# Patient Record
Sex: Female | Born: 1990 | Race: Black or African American | Hispanic: No | Marital: Single | Smoking: Never smoker
Health system: Southern US, Community
[De-identification: ages and names within clinical notes are randomized; demographics above are authoritative.]

## PROBLEM LIST (undated history)

## (undated) HISTORY — PX: ABDOMINAL SURGERY: SHX537

---

## 2010-06-27 ENCOUNTER — Emergency Department (HOSPITAL_COMMUNITY)
Admission: EM | Admit: 2010-06-27 | Discharge: 2010-06-27 | Payer: Self-pay | Source: Home / Self Care | Admitting: Emergency Medicine

## 2014-12-17 ENCOUNTER — Other Ambulatory Visit (HOSPITAL_COMMUNITY): Payer: Self-pay | Admitting: Family Medicine

## 2014-12-17 DIAGNOSIS — Z3201 Encounter for pregnancy test, result positive: Secondary | ICD-10-CM

## 2014-12-19 ENCOUNTER — Other Ambulatory Visit (HOSPITAL_COMMUNITY): Payer: Self-pay | Admitting: Family Medicine

## 2014-12-19 ENCOUNTER — Ambulatory Visit (HOSPITAL_COMMUNITY)
Admission: RE | Admit: 2014-12-19 | Discharge: 2014-12-19 | Disposition: A | Discharge status: Home / Self Care | Payer: BC Managed Care – PPO | Source: Ambulatory Visit | Attending: Family Medicine | Admitting: Family Medicine

## 2014-12-19 DIAGNOSIS — Z3201 Encounter for pregnancy test, result positive: Secondary | ICD-10-CM | POA: Diagnosis not present

## 2015-11-25 IMAGING — US US OB COMP LESS 14 WK
1 series · 13 of 28 positions shown · non-contrast
Comparison: None.

CLINICAL DATA: 24-year-old female is pregnant with unknown LMP.
Initial encounter.

EXAM:
OBSTETRIC <14 WK US AND TRANSVAGINAL OB US
TECHNIQUE: Both transabdominal and transvaginal ultrasound examinations were
performed for complete evaluation of the gestation as well as the
maternal uterus, adnexal regions, and pelvic cul-de-sac.
Transvaginal technique was performed to assess early pregnancy.

[Series 1: us ob comp less 14 wk · 13 of 81 slices shown]
[im 3/81]
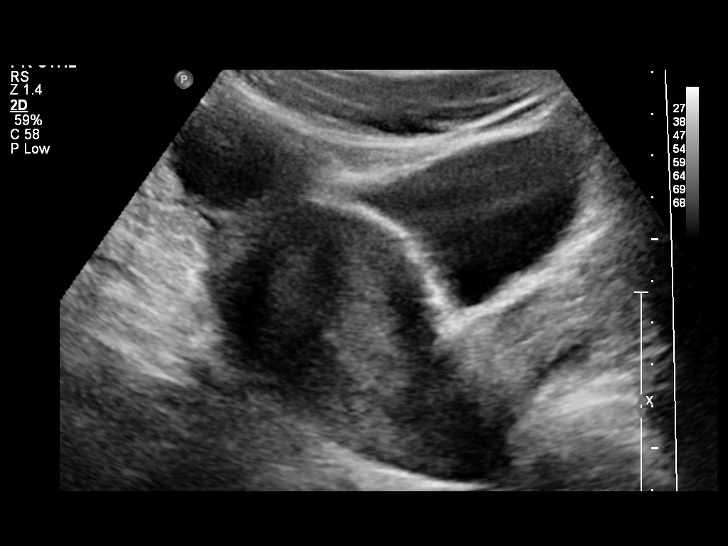
[im 9/81]
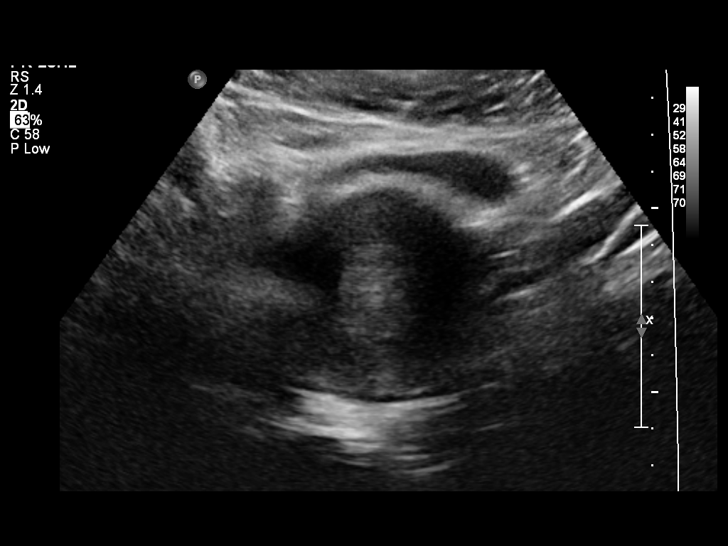
[im 15/81]
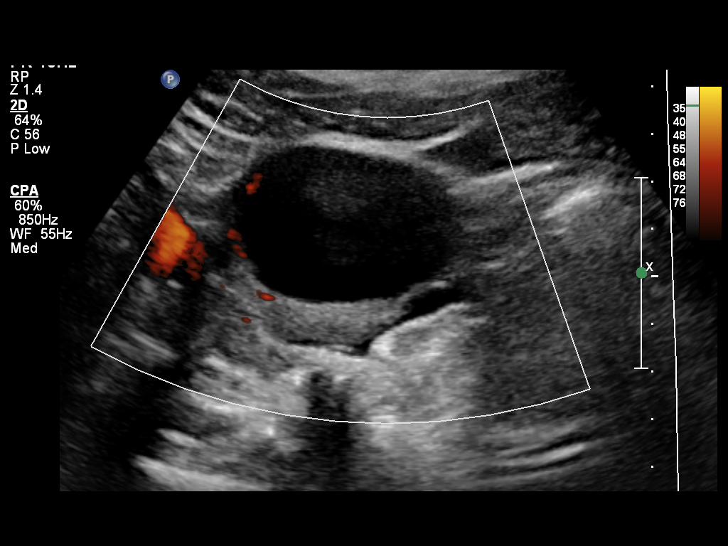
[im 21/81]
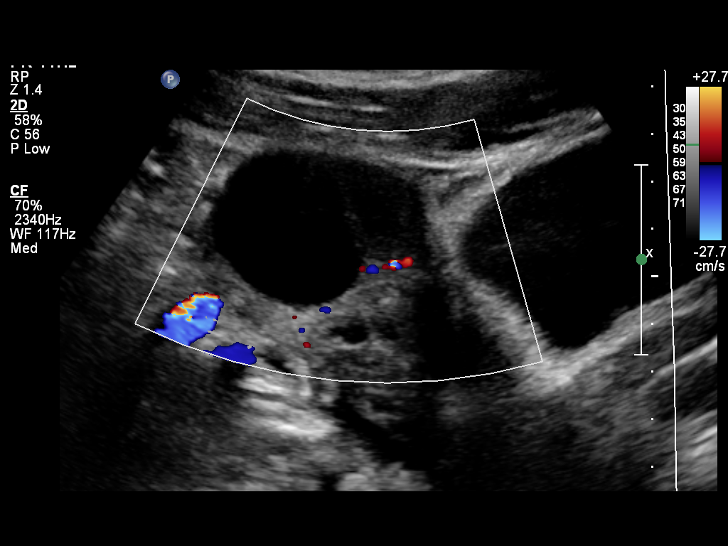
[im 27/81]
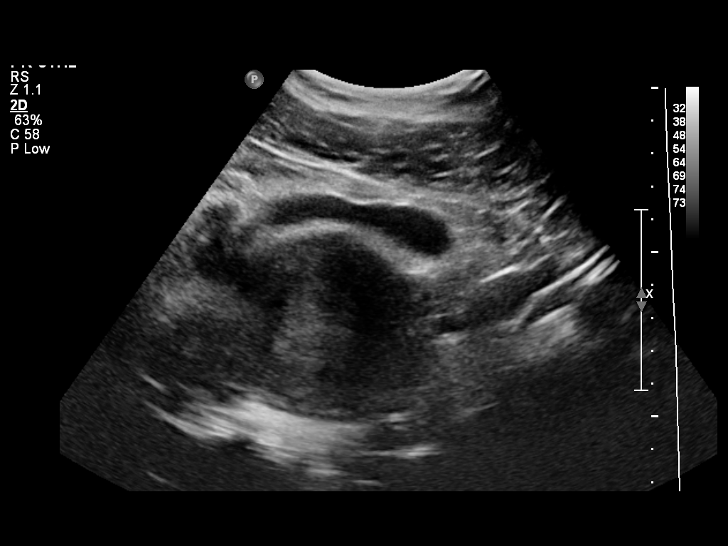
[im 33/81]
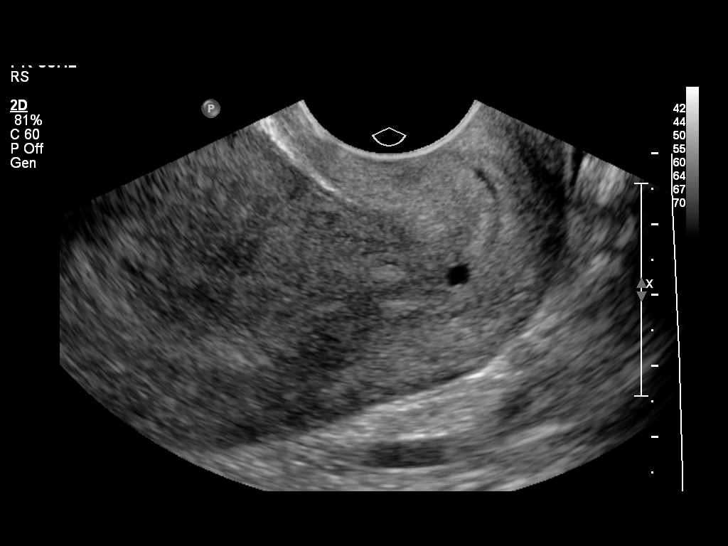
[im 42/81]
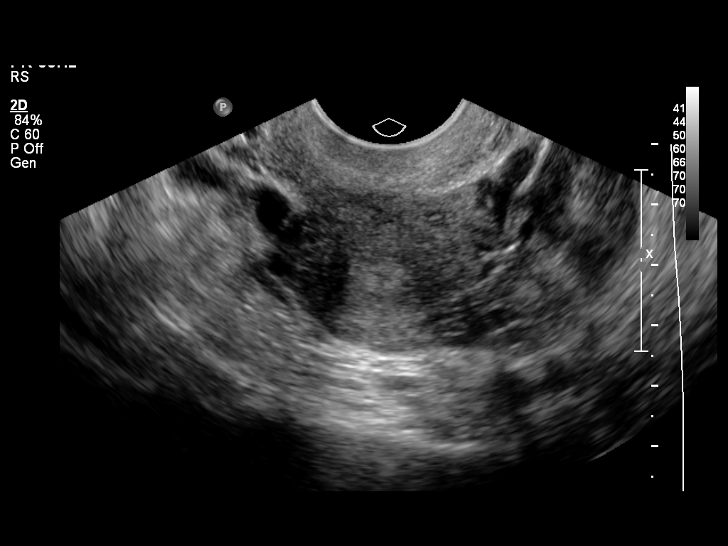
[im 48/81]
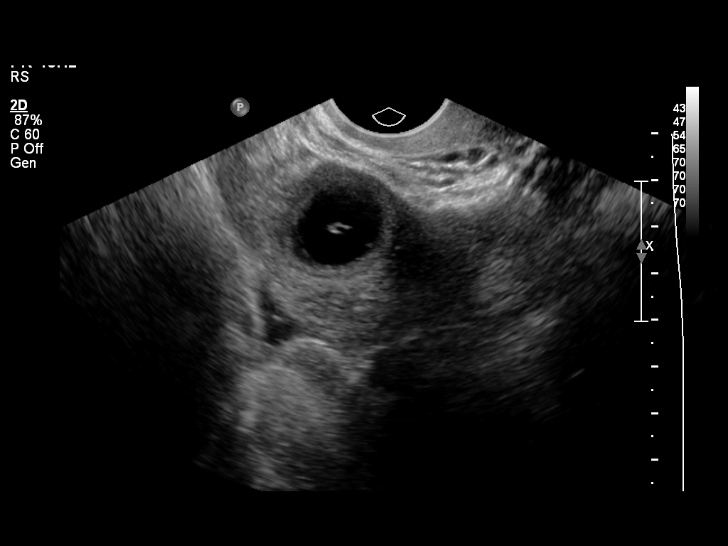
[im 54/81]
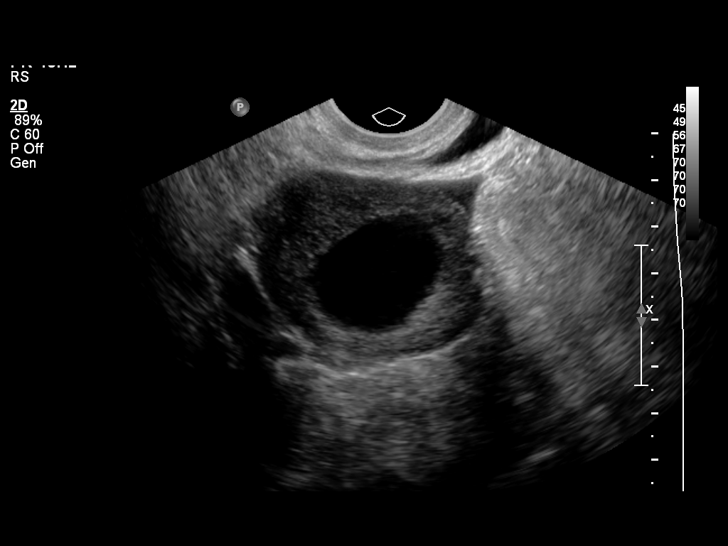
[im 60/81]
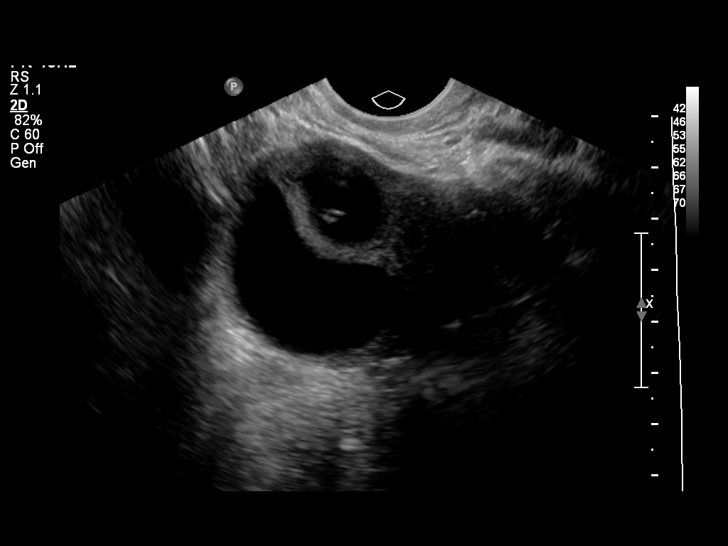
[im 66/81]
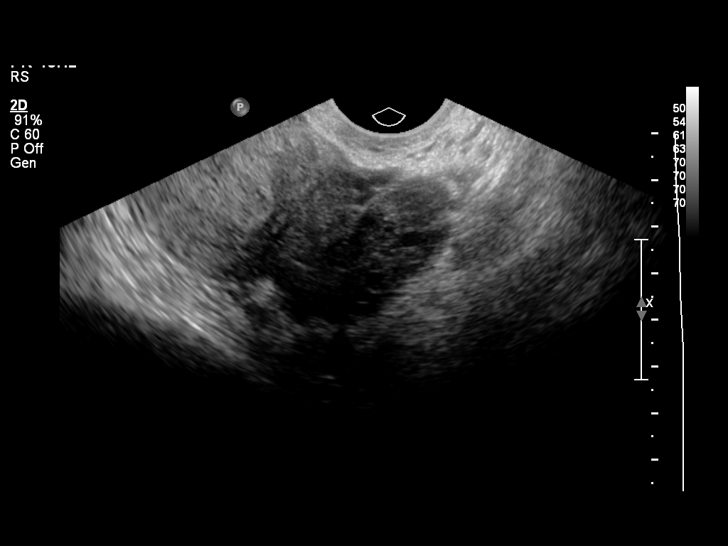
[im 72/81]
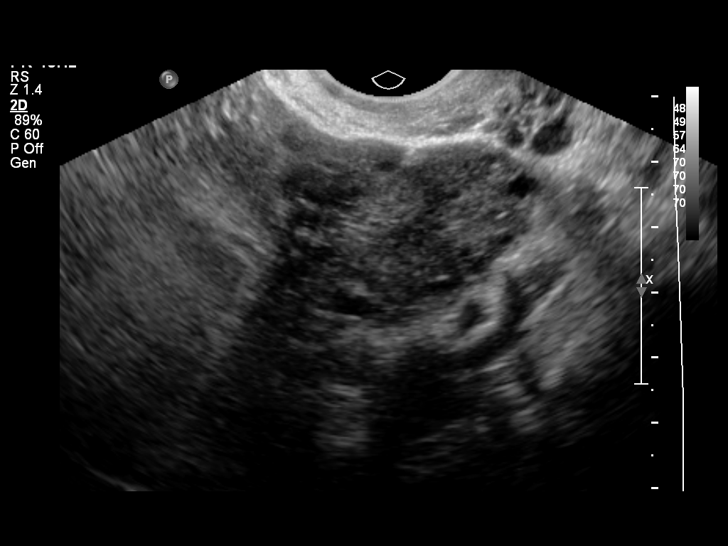
[im 78/81]
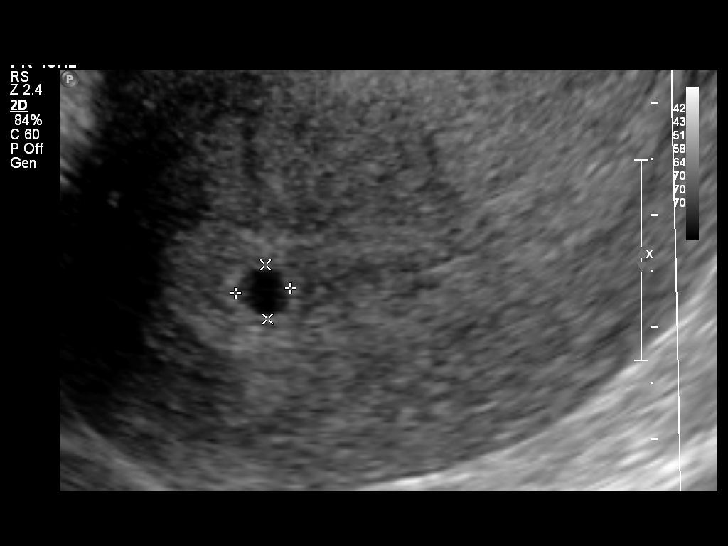

[13 of 28 positions shown; findings below may reference images not displayed]

FINDINGS: Intrauterine gestational sac: Single, only visible with transvaginal
technique.

Yolk sac:  Not visible

Embryo:  Not visible

Cardiac Activity: Not detected

MSD: 5  mm   5 w   0  d

Maternal uterus/adnexae: No subchorionic hemorrhage. No pelvic free
fluid. The left ovary appears normal measuring 4.1 x 2.3 x 3.5 cm.

There is a septate cyst or 2 adjacent cysts in the right ovary
encompassing 5 cm diameter. Aside from the smooth septation these
have a simple internal appearance. The right ovary overall measures
6.8 x 4.5 x 4.9 cm.
IMPRESSION: 1. Probable early intrauterine gestational sac, but no yolk sac,
fetal pole, or cardiac activity yet visualized. Recommend follow-up
quantitative B-HCG levels and follow-up US in 14 days to confirm and
assess viability. This recommendation follows SRU consensus
guidelines: Diagnostic Criteria for Nonviable Pregnancy Early in the
First Trimester. N Engl J Med 8425; [DATE].
2. Two adjacent simple cysts or a septated cyst in the right ovary
measuring up to 5 cm diameter. Attention directed on followup.
3. No subchorionic hemorrhage or pelvic free fluid. Normal left
ovary.

## 2016-01-06 ENCOUNTER — Emergency Department (HOSPITAL_COMMUNITY)
Admission: EM | Admit: 2016-01-06 | Discharge: 2016-01-06 | Disposition: A | Discharge status: Home / Self Care | Payer: BC Managed Care – PPO | Attending: Emergency Medicine | Admitting: Emergency Medicine

## 2016-01-06 ENCOUNTER — Encounter (HOSPITAL_COMMUNITY): Payer: Self-pay | Admitting: Emergency Medicine

## 2016-01-06 ENCOUNTER — Emergency Department (HOSPITAL_COMMUNITY): Payer: BC Managed Care – PPO

## 2016-01-06 DIAGNOSIS — R1084 Generalized abdominal pain: Secondary | ICD-10-CM | POA: Diagnosis not present

## 2016-01-06 DIAGNOSIS — Z79899 Other long term (current) drug therapy: Secondary | ICD-10-CM | POA: Diagnosis not present

## 2016-01-06 DIAGNOSIS — R202 Paresthesia of skin: Secondary | ICD-10-CM | POA: Diagnosis not present

## 2016-01-06 DIAGNOSIS — Z7982 Long term (current) use of aspirin: Secondary | ICD-10-CM | POA: Insufficient documentation

## 2016-01-06 DIAGNOSIS — R6883 Chills (without fever): Secondary | ICD-10-CM | POA: Diagnosis present

## 2016-01-06 LAB — COMPREHENSIVE METABOLIC PANEL
ALBUMIN: 3.4 g/dL — AB (ref 3.5–5.0)
ALT: 23 U/L (ref 14–54)
ANION GAP: 13 (ref 5–15)
AST: 24 U/L (ref 15–41)
Alkaline Phosphatase: 66 U/L (ref 38–126)
BUN: 9 mg/dL (ref 6–20)
CHLORIDE: 106 mmol/L (ref 101–111)
CO2: 19 mmol/L — AB (ref 22–32)
Calcium: 9.6 mg/dL (ref 8.9–10.3)
Creatinine, Ser: 0.83 mg/dL (ref 0.44–1.00)
GFR calc non Af Amer: >60 mL/min (ref >60)
Glucose, Bld: 83 mg/dL (ref 65–99)
Potassium: 3.4 mmol/L — ABNORMAL LOW (ref 3.5–5.1)
SODIUM: 138 mmol/L (ref 135–145)
Total Bilirubin: 0.2 mg/dL — ABNORMAL LOW (ref 0.3–1.2)
Total Protein: 7.7 g/dL (ref 6.5–8.1)

## 2016-01-06 LAB — CBC WITH DIFFERENTIAL/PLATELET
Basophils Absolute: 0 10*3/uL (ref 0.0–0.1)
Basophils Relative: 0 %
Eosinophils Absolute: 0.2 10*3/uL (ref 0.0–0.7)
Eosinophils Relative: 2 %
HEMATOCRIT: 32.9 % — AB (ref 36.0–46.0)
HEMOGLOBIN: 10.7 g/dL — AB (ref 12.0–15.0)
LYMPHS ABS: 2.5 10*3/uL (ref 0.7–4.0)
LYMPHS PCT: 20 %
MCH: 28.3 pg (ref 26.0–34.0)
MCHC: 32.5 g/dL (ref 30.0–36.0)
MCV: 87 fL (ref 78.0–100.0)
Monocytes Absolute: 0.7 10*3/uL (ref 0.1–1.0)
Monocytes Relative: 6 %
NEUTROS PCT: 72 %
Neutro Abs: 9 10*3/uL — ABNORMAL HIGH (ref 1.7–7.7)
Platelets: 640 10*3/uL — ABNORMAL HIGH (ref 150–400)
RBC: 3.78 MIL/uL — AB (ref 3.87–5.11)
RDW: 15.3 % (ref 11.5–15.5)
WBC: 12.5 10*3/uL — AB (ref 4.0–10.5)

## 2016-01-06 LAB — I-STAT CG4 LACTIC ACID, ED: Lactic Acid, Venous: 1.5 mmol/L (ref 0.5–1.9)

## 2016-01-06 MED ORDER — TRAMADOL HCL 50 MG PO TABS: 50.0000 mg | ORAL_TABLET | Freq: Four times a day (QID) | ORAL | Status: DC | PRN

## 2016-01-06 MED ORDER — GABAPENTIN 300 MG PO CAPS: 300.0000 mg | ORAL_CAPSULE | Freq: Two times a day (BID) | ORAL | Status: DC

## 2016-01-06 MED ORDER — ACETAMINOPHEN 325 MG PO TABS
ORAL_TABLET | ORAL | Status: DC
Start: 2016-01-06 — End: 2016-01-06
  Filled 2016-01-06: qty 2

## 2016-01-06 MED ORDER — ACETAMINOPHEN 325 MG PO TABS
650.0000 mg | ORAL_TABLET | Freq: Once | ORAL | Status: AC | PRN
  Administered 2016-01-06: 650 mg via ORAL

## 2016-01-06 NOTE — ED Notes (Signed)
[place] the [place]. Pt has four small incision in abd. Two under breast and one in back. Pt denies any pain in her abd states,"her whole abd feels numb". For the past 3 days pt states she has been sweaty profusely and worse at night. Pt is diaphoretic at triage. Pt states she has also been having this usual itching, tingling ,burning sensation that's starts in her back and goes across her abd that is so intense when it happens she states she cant function. Pt reports taking to [facility] 1 hr ago.

## 2016-01-06 NOTE — ED Provider Notes (Addendum)
CSN: [id number]     Arrival date & time 01/06/16  1745 History  By signing my name below, I, Marisue HumbleMichelle Chaffee, attest that this documentation has been prepared under the direction and in the presence of Gwyneth SproutWhitney Filemon Breton, MD . Electronically Signed: Marisue HumbleMichelle Chaffee, Scribe. 01/06/2016. 8:25 PM.    Chief Complaint  Patient presents with  . Chills  . Tingling    The history is provided by the patient. No language interpreter was used.   HPI Comments:  Gus Rankinyniece Hottenstein is a 25 y.o. female who presents to the Emergency Department complaining of persistent, intermittent numbness, burning and itching over right abdomen and right flank onset 5 days ago. Pt reports associated drainage from seroma x2 since surgery. She states "I feel like my nervous system is shutting down"; she notes almost dropping her son. Pt had liposuction and EBO in the [place] on 12/23/15 without complications. She has had lymphatic massage with temporary relief; she has also taken medications for inflammation with temporary relief. Denies abdominal pain currently.  History reviewed. No pertinent past medical history. Past Surgical History  Procedure Laterality Date  . Abdominal surgery     No family history on file. Social History  Substance Use Topics  . Smoking status: Never Smoker   . Smokeless tobacco: None  . Alcohol Use: No   OB History    No data available     Review of Systems  HENT: Positive for ear pain.   Gastrointestinal:       Abdominal numbness  Neurological: Positive for weakness and numbness.  All other systems reviewed and are negative.   Allergies  Review of patient's allergies indicates no known allergies.  Home Medications   Prior to Admission medications   Medication Sig Start Date End Date Taking? Authorizing Provider  acetaminophen (TYLENOL) 500 MG tablet Take 500 mg by mouth every 6 (six) hours as needed for moderate pain.   Yes Historical Provider, MD  Aspirin-Caffeine  500-32.5 MG TABS Take 1 tablet by mouth daily as needed (FOR BACK PAIN).   Yes Historical Provider, MD  B Complex-C (SUPER B COMPLEX PO) Take 1 tablet by mouth daily.   Yes Historical Provider, MD  ferrous sulfate 325 (65 FE) MG EC tablet Take 325 mg by mouth daily with breakfast.   Yes Historical Provider, MD  folic acid (FOLVITE) 400 MCG tablet Take 400 mcg by mouth daily.   Yes Historical Provider, MD  Misc Natural Products (TURMERIC CURCUMIN) CAPS Take 1 capsule by mouth 2 (two) times daily.   Yes Historical Provider, MD  vitamin C (ASCORBIC ACID) 500 MG tablet Take 500 mg by mouth daily.   Yes Historical Provider, MD   BP 138/93 mmHg  Pulse 106  Temp(Src) 98.8 F (37.1 C) (Oral)  Resp 25  Ht 5\' 5"  (1.651 m)  Wt 120 lb (54.432 kg)  BMI 19.97 kg/m2  SpO2 100%  LMP 11/10/2015   Physical Exam  Constitutional: She is oriented to person, place, and time. She appears well-developed and well-nourished. No distress.  HENT:  Head: Normocephalic and atraumatic.  Eyes: EOM are normal.  Neck: Normal range of motion.  Cardiovascular: Regular rhythm and normal heart sounds.  Tachycardia present.   Pulmonary/Chest: Effort normal and breath sounds normal.  Abdominal: She exhibits no distension. There is no tenderness.  Firmness over the abdomen; collection of fluid over RLQ; no erythema, warmth, or drainage  Musculoskeletal: Normal range of motion.  Neurological: She is alert and oriented to person,  place, and time.  Skin: Skin is warm and dry.  Psychiatric: She has a normal mood and affect. Judgment normal.  Nursing note and vitals reviewed.   ED Course  Procedures  DIAGNOSTIC STUDIES:  Oxygen Saturation is 100% on RA, normal by my interpretation.    COORDINATION OF CARE:  8:17 PM Will give prescription for pain medication and nerve medication. Discussed treatment plan with pt at bedside and pt agreed to plan.  Labs Review Labs Reviewed  COMPREHENSIVE METABOLIC PANEL - Abnormal;  Notable for the following:    Potassium 3.4 (*)    CO2 19 (*)    Albumin 3.4 (*)    Total Bilirubin 0.2 (*)    All other components within normal limits  CBC WITH DIFFERENTIAL/PLATELET - Abnormal; Notable for the following:    WBC 12.5 (*)    RBC 3.78 (*)    Hemoglobin 10.7 (*)    HCT 32.9 (*)    Platelets 640 (*)    Neutro Abs 9.0 (*)    All other components within normal limits  I-STAT CG4 LACTIC ACID, ED    Imaging Review Dg Chest 2 View  01/06/2016  CLINICAL DATA:  Fever and chills for 5 days EXAM: CHEST  2 VIEW COMPARISON:  None. FINDINGS: The heart size and mediastinal contours are within normal limits. There are small calcified granuloma is in bilateral lungs. Calcified lymph nodes are identified in bilateral hilum. There is no focal infiltrate, pulmonary edema, or pleural effusion. The visualized skeletal structures are unremarkable. IMPRESSION: No active cardiopulmonary disease.  Old granulomatous changed. Electronically Signed   By: Sherian ReinWei-Chen  Lin M.D.   On: 01/06/2016 18:59   I have personally reviewed and evaluated these images and lab results as part of my medical decision-making.   EKG Interpretation None      MDM   Final diagnoses:  Generalized abdominal pain   The patient is a healthy 25 year old female who recently underwent liposuction in the [place] 14 days ago.  Patient initially had no complications but then developed a seroma [place]. She has had a seroma drained twice but it is decreasing in size. However for the last few day she started to have numbness, itching and tingling over her entire abdomen. Prior to this it was numb and feel at this time she is just having neuropathic pain. She is otherwise well-appearing. Tachycardia improved with rest. She has been eating and drinking normally. There is no evidence of cellulitis or infection to the abdomen. Patient given gabapentin for nerve pain.   I personally performed  the services described in this documentation, which was scribed in my presence.  The recorded information has been reviewed and considered.   Gwyneth SproutWhitney Fronie Holstein, MD 01/06/16 [id number]  Gwyneth SproutWhitney Alila Sotero, MD 01/06/16 [phone]

## 2016-01-15 ENCOUNTER — Encounter (HOSPITAL_COMMUNITY): Payer: Self-pay | Admitting: Emergency Medicine

## 2016-01-15 ENCOUNTER — Emergency Department (HOSPITAL_COMMUNITY)
Admission: EM | Admit: 2016-01-15 | Discharge: 2016-01-15 | Disposition: A | Discharge status: Home / Self Care | Payer: BC Managed Care – PPO | Attending: Emergency Medicine | Admitting: Emergency Medicine

## 2016-01-15 DIAGNOSIS — Z7982 Long term (current) use of aspirin: Secondary | ICD-10-CM | POA: Diagnosis not present

## 2016-01-15 DIAGNOSIS — Z79899 Other long term (current) drug therapy: Secondary | ICD-10-CM | POA: Insufficient documentation

## 2016-01-15 DIAGNOSIS — M549 Dorsalgia, unspecified: Secondary | ICD-10-CM | POA: Diagnosis present

## 2016-01-15 DIAGNOSIS — R1084 Generalized abdominal pain: Secondary | ICD-10-CM | POA: Insufficient documentation

## 2016-01-15 MED ORDER — TRAMADOL HCL 50 MG PO TABS: 50.0000 mg | ORAL_TABLET | Freq: Four times a day (QID) | ORAL | Status: AC | PRN

## 2016-01-15 MED ORDER — GABAPENTIN 300 MG PO CAPS: 300.0000 mg | ORAL_CAPSULE | Freq: Three times a day (TID) | ORAL | Status: AC

## 2016-01-15 NOTE — Discharge Instructions (Signed)
Pain Without a Known Cause °WHAT IS PAIN WITHOUT A KNOWN CAUSE? °Pain can occur in any part of the body and can range from mild to severe. Sometimes no cause can be found for why you are having pain. Some types of pain that can occur without a known cause include:  °· Headache. °· Back pain. °· Abdominal pain. °· Neck pain. °HOW IS PAIN WITHOUT A KNOWN CAUSE DIAGNOSED?  °Your health care provider will try to find the cause of your pain. This may include: °· Physical exam. °· Medical history. °· Blood tests. °· Urine tests. °· X-rays. °If no cause is found, your health care provider may diagnose you with pain without a known cause.  °IS THERE TREATMENT FOR PAIN WITHOUT A CAUSE?  °Treatment depends on the kind of pain you have. Your health care provider may prescribe medicines to help relieve your pain.  °WHAT CAN I DO AT HOME FOR MY PAIN?  °· Take medicines only as directed by your health care provider. °· Stop any activities that cause pain. During periods of severe pain, bed rest may help. °· Try to reduce your stress with activities such as yoga or meditation. Talk to your health care provider for other stress-reducing activity recommendations. °· Exercise regularly, if approved by your health care provider. °· Eat a healthy diet that includes fruits and vegetables. This may improve pain. Talk to your health care provider if you have any questions about your diet. °WHAT IF MY PAIN DOES NOT GET BETTER?  °If you have a painful condition and no reason can be found for the pain or the pain gets worse, it is important to follow up with your health care provider. It may be necessary to repeat tests and look further for a possible cause.  °  °This information is not intended to replace advice given to you by your health care provider. Make sure you discuss any questions you have with your health care provider. °  °Document Released: 03/22/2001 Document Revised: 07/18/2014 Document Reviewed: 11/12/2013 °Elsevier  Interactive Patient Education ©2016 [employer] ° °

## 2016-01-15 NOTE — ED Notes (Signed)
Pt states all she wants is the gabapentin, has appt with PCP on Wednesday to ask for prescription but the pain is too bad to wait.

## 2016-01-15 NOTE — ED Provider Notes (Signed)
CSN: [id number]     Arrival date & time 01/15/16  1102 History  By signing my name below, I, Evon Slackerrance Branch, attest that this documentation has been prepared under the direction and in the presence of Bethel BornKelly Marie Maegan Buller, PA-C. Electronically Signed: Evon Slackerrance Branch, ED Scribe. 01/15/2016. 11:43 AM.      Chief Complaint  Patient presents with  . Back Pain  . Post-op Problem    Patient is a 25 y.o. female presenting with back pain. The history is provided by the patient. No language interpreter was used.  Back Pain Associated symptoms: no fever    HPI Comments: Allison Villarreal is a 25 y.o. female who presents to the Emergency Department complaining of flank pain and abdominal pain after [place]. Pt states that she was evaluated in the ED on 01/06/16 for similar symptoms. She states that she was prescribed gabapentin and tramadol that provided relief but she ran out on Sunday. [facility] she went to [facility] and they refilled her medicine however now she is out again because she has been having to take more Gabapentin to provide relief. She has an appointment with her PCP on Wednesday. She states that she has associated chills when palpating her skin. She also reports associated itching due to all the swelling in her abdomen. She states that last night she tried icy hot which made the pain much worse. Denies fever, N/V/D.   History reviewed. No pertinent past medical history. Past Surgical History  Procedure Laterality Date  . Abdominal surgery     No family history on file. Social History  Substance Use Topics  . Smoking status: Never Smoker   . Smokeless tobacco: None  . Alcohol Use: No   OB History    No data available     Review of Systems  Constitutional: Positive for chills. Negative for fever.  Gastrointestinal: Negative for nausea and vomiting.  Skin:       Hypersensitivity of skin  Neurological:       Tingling over abdomen     Allergies  Review of patient's allergies indicates no known allergies.  Home Medications   Prior to Admission medications   Medication Sig Start Date End Date Taking? Authorizing Provider  acetaminophen (TYLENOL) 500 MG tablet Take 500 mg by mouth every 6 (six) hours as needed for moderate pain.    Historical Provider, MD  Aspirin-Caffeine 500-32.5 MG TABS Take 1 tablet by mouth daily as needed (FOR BACK PAIN).    Historical Provider, MD  B Complex-C (SUPER B COMPLEX PO) Take 1 tablet by mouth daily.    Historical Provider, MD  ferrous sulfate 325 (65 FE) MG EC tablet Take 325 mg by mouth daily with breakfast.    Historical Provider, MD  folic acid (FOLVITE) 400 MCG tablet Take 400 mcg by mouth daily.    Historical Provider, MD  gabapentin (NEURONTIN) 300 MG capsule Take 1 capsule (300 mg total) by mouth 2 (two) times daily. 01/06/16   Gwyneth SproutWhitney Plunkett, MD  Misc Natural Products (TURMERIC CURCUMIN) CAPS Take 1 capsule by mouth 2 (two) times daily.    Historical Provider, MD  traMADol (ULTRAM) 50 MG tablet Take 1 tablet (50 mg total) by mouth every 6 (six) hours as needed. 01/06/16   Gwyneth SproutWhitney Plunkett, MD  vitamin C (ASCORBIC ACID) 500 MG tablet Take 500 mg by mouth daily.    Historical Provider, MD   BP 148/89 mmHg  Pulse 99  Temp(Src) 98.3  F (36.8 C) (Oral)  Resp 16  SpO2 100%  LMP 01/15/2016 (Exact Date)   Physical Exam  Constitutional: She is oriented to person, place, and time. She appears well-developed and well-nourished. No distress.  HENT:  Head: Normocephalic and atraumatic.  Eyes: Conjunctivae are normal. Pupils are equal, round, and reactive to light. Right eye exhibits no discharge. Left eye exhibits no discharge. No scleral icterus.  Neck: Normal range of motion.  Cardiovascular: Normal rate.   Pulmonary/Chest: Effort normal. No respiratory distress.  Abdominal: Normal appearance and bowel sounds are normal. She exhibits no distension. There is tenderness.  Abdomen  is hard and tense with diffuse mild tenderness, mostly over the skin. Incisional sites under breasts, over lower abdomen, and over mid-back are healing well with no signs of infection.  Neurological: She is alert and oriented to person, place, and time.  Skin: Skin is warm and dry.  Skin is hypersensitive  Psychiatric: She has a normal mood and affect. Her behavior is normal.    ED Course  Procedures (including critical care time) DIAGNOSTIC STUDIES: Oxygen Saturation is 100% on RA, normal by my interpretation.    COORDINATION OF CARE: 11:43 AM-Discussed treatment plan which includes gabapentin and tramadol with pt at bedside and pt agreed to plan.     Labs Review Labs Reviewed - No data to display  Imaging Review No results found.    EKG Interpretation None      MDM   Final diagnoses:  Generalized abdominal pain   25 year old female who presents for medication refill. Patient is afebrile, not tachycardic or tachypneic, and not hypoxic. She is mildly hypertensive. She states the Gabapentin and Tramadol worked really well but she keeps running out. She states the pain is slowly getting better however it still interferes with her daily activities. Will rx higher dose of of Gabapentin and short course of Tramadol to bridge her till her PCP appointment on Wednesday. No red flag abdominal signs/symptoms. Patient is NAD, non-toxic, with stable VS. Patient is informed of clinical course, understands medical decision making process, and agrees with plan. Opportunity for questions provided and all questions answered. Return precautions given.   I personally performed the services described in this documentation, which was scribed in my presence. The recorded information has been reviewed and is accurate.      Bethel BornKelly Marie Dekayla Prestridge, PA-C 01/15/16 1447  Geoffery Lyonsouglas Delo, MD 01/15/16 [phone]

## 2016-01-15 NOTE — ED Notes (Signed)
Pt states she had liposuction on June 14th, pt came here on the 28th of June due to pain in her back, was given gabapentin and tramadol and it helped the pain. Pt states she feels the pain again in her back. Pt states it feels the same as last time but is out of her gabapentin and tramadol. Pt is ambulatory, in NAD.

## 2016-12-12 IMAGING — DX DG CHEST 2V
2 series · 2 of 2 positions shown · non-contrast
Comparison: None.

CLINICAL DATA: Fever and chills for 5 days

EXAM:
CHEST  2 VIEW

[chest pa]
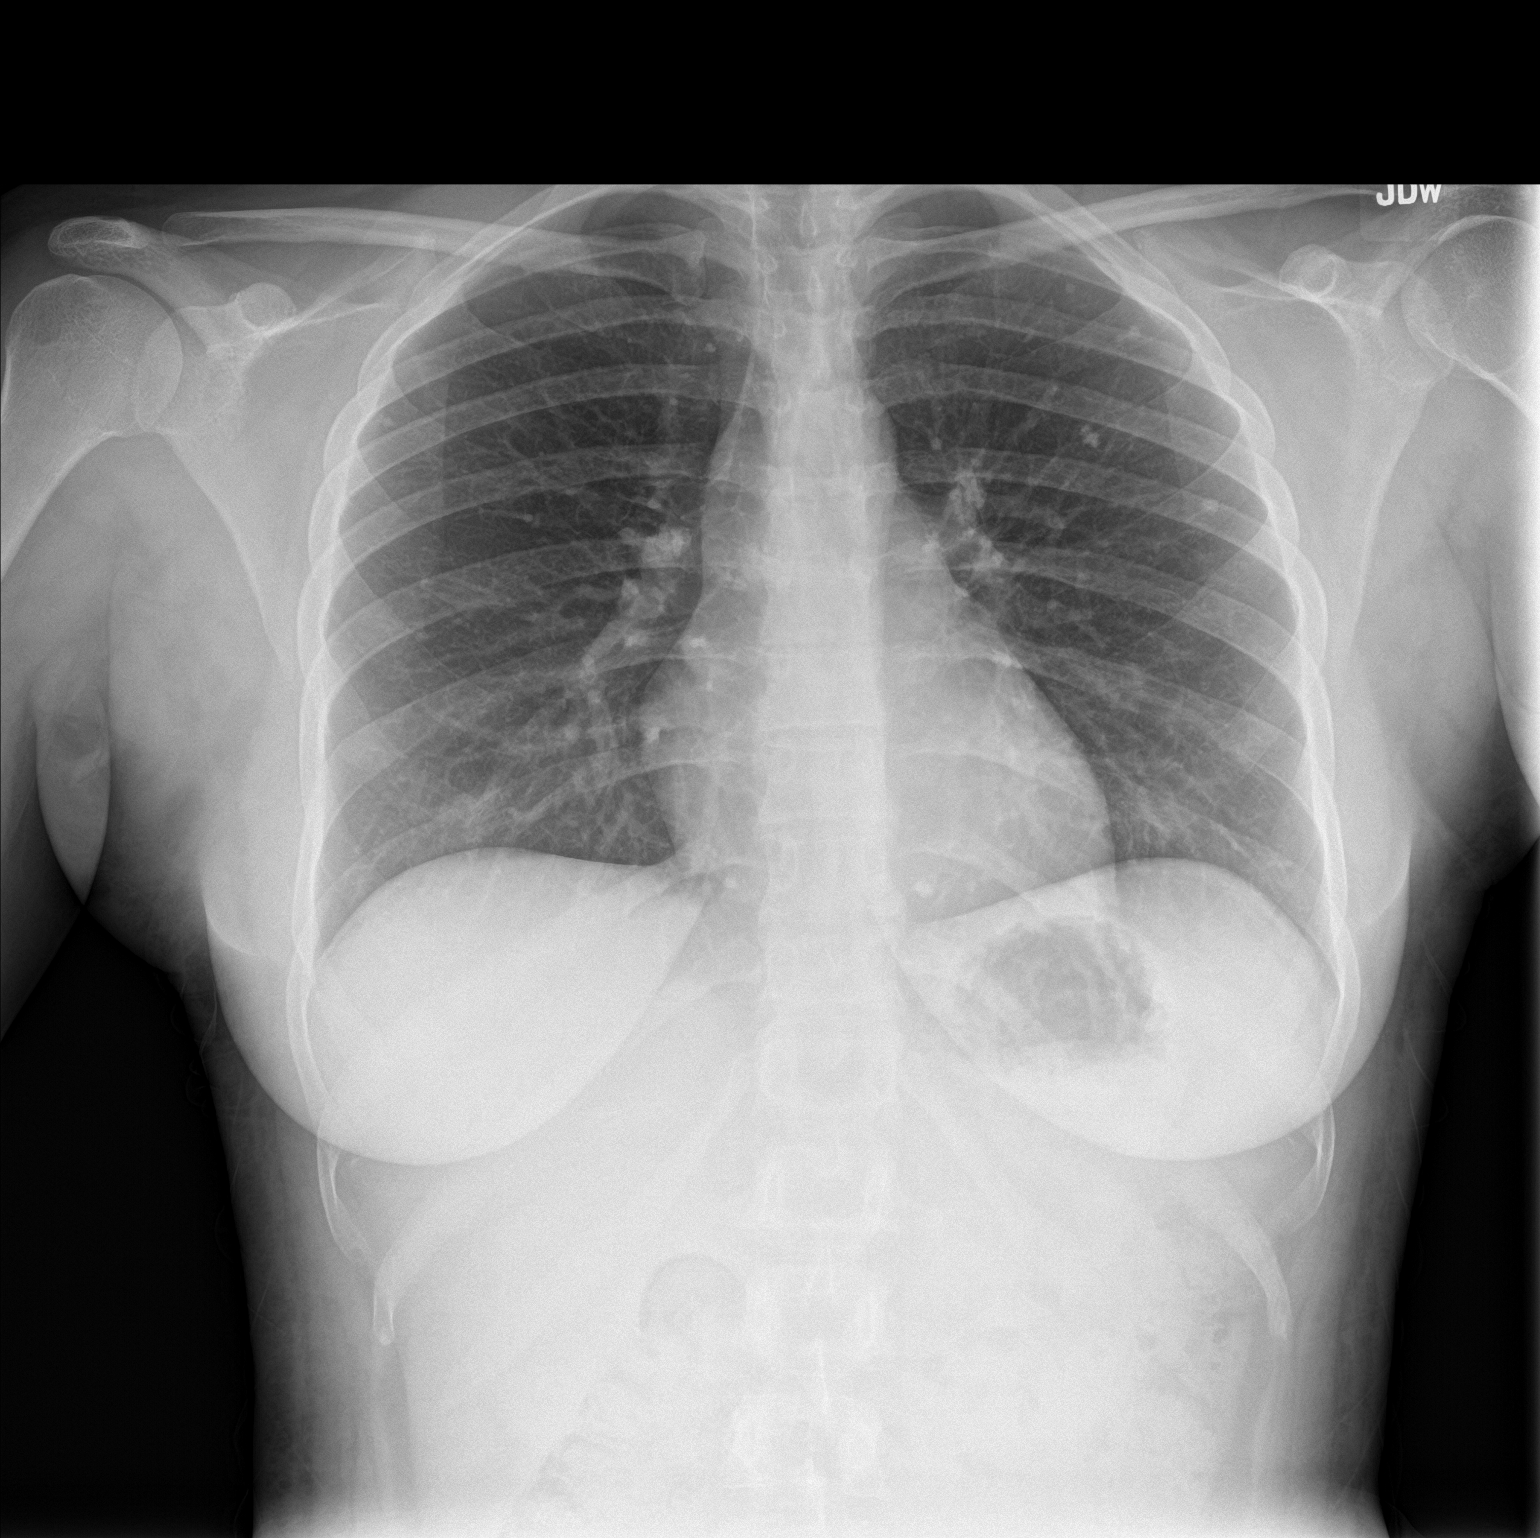

[chest lat]
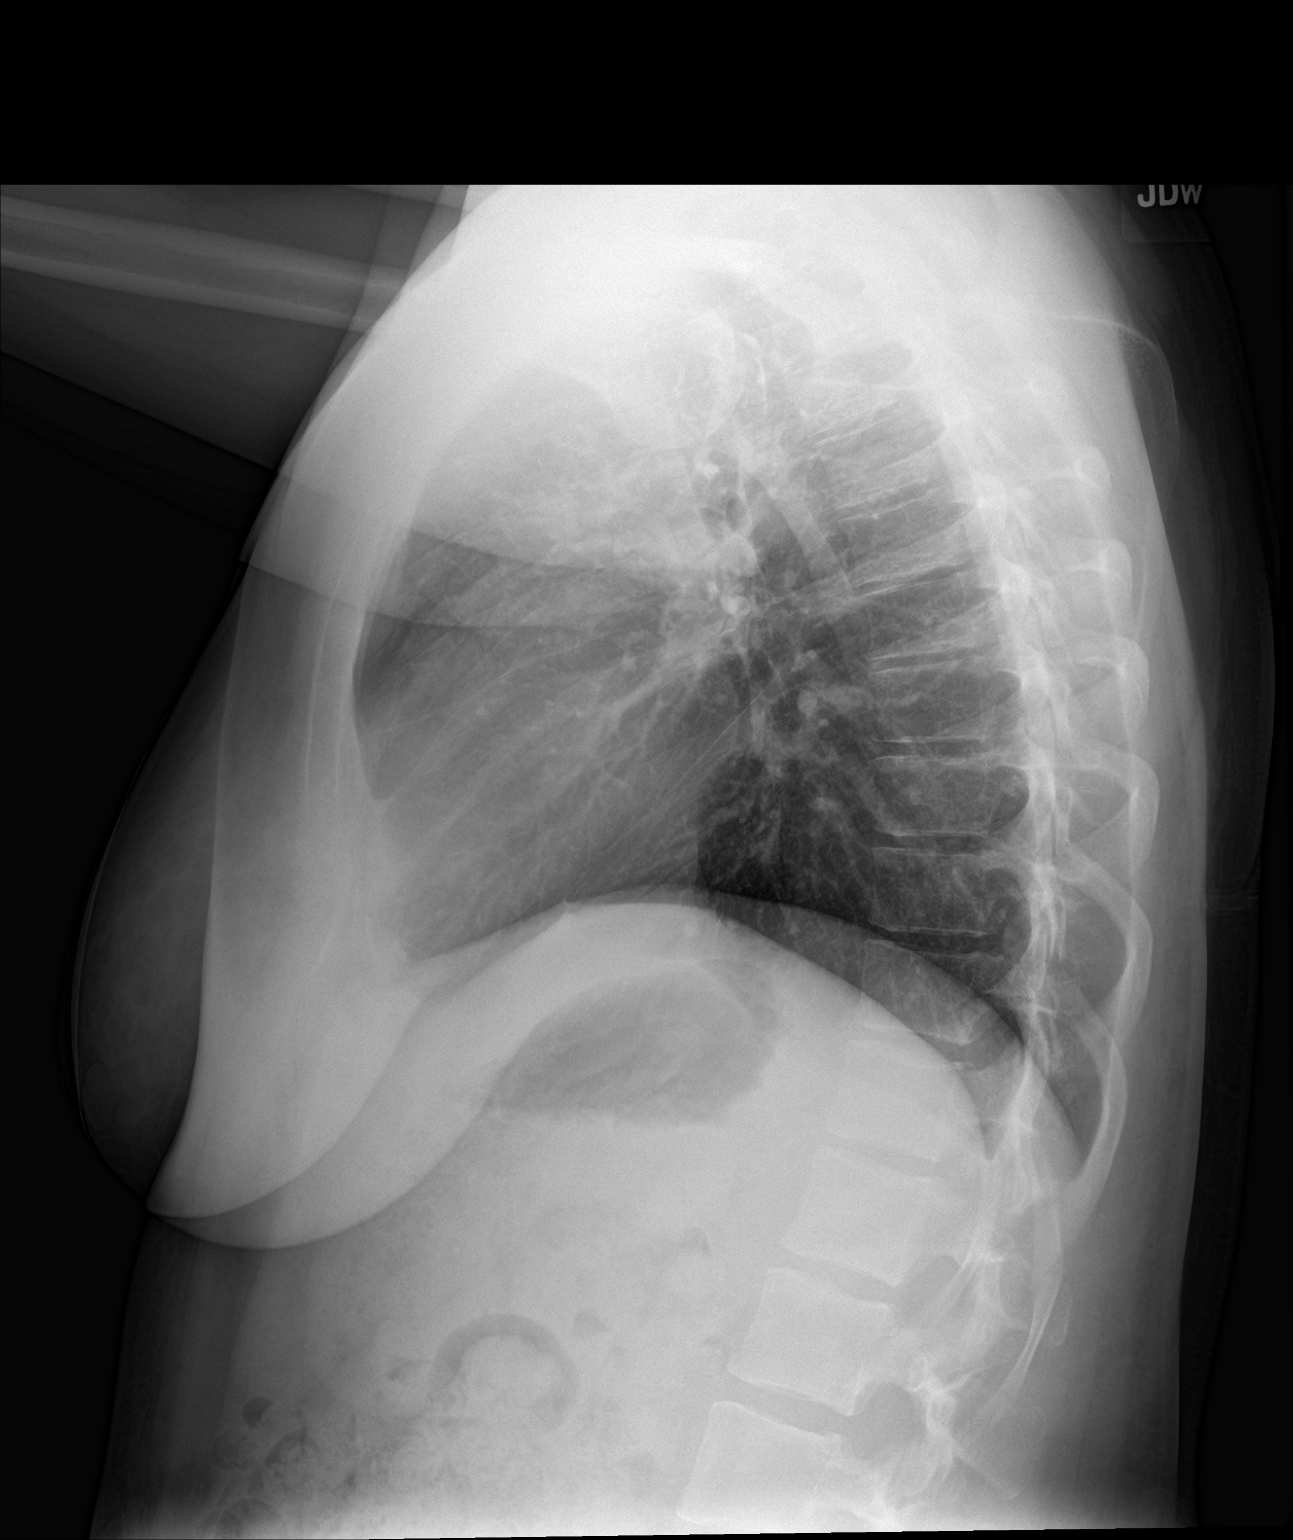

[2 of 2 positions shown; findings below may reference images not displayed]

FINDINGS: The heart size and mediastinal contours are within normal limits.
There are small calcified granuloma is in bilateral lungs. Calcified
lymph nodes are identified in bilateral hilum. There is no focal
infiltrate, pulmonary edema, or pleural effusion. The visualized
skeletal structures are unremarkable.
IMPRESSION: No active cardiopulmonary disease.  Old granulomatous changed.
